# Patient Record
Sex: Female | Born: 1975 | Race: White | Hispanic: No | Smoking: Former smoker
Health system: Southern US, Community
[De-identification: ages and names within clinical notes are randomized; demographics above are authoritative.]

## PROBLEM LIST (undated history)

## (undated) DIAGNOSIS — M329 Systemic lupus erythematosus, unspecified: Secondary | ICD-10-CM

## (undated) DIAGNOSIS — IMO0002 Reserved for concepts with insufficient information to code with codable children: Secondary | ICD-10-CM

## (undated) DIAGNOSIS — M359 Systemic involvement of connective tissue, unspecified: Secondary | ICD-10-CM

## (undated) DIAGNOSIS — I2699 Other pulmonary embolism without acute cor pulmonale: Secondary | ICD-10-CM

## (undated) HISTORY — PX: TUBAL LIGATION: SHX77

## (undated) HISTORY — PX: PTOSIS REPAIR: SHX6568

## (undated) HISTORY — PX: CHOLECYSTECTOMY: SHX55

## (undated) HISTORY — PX: TONSILLECTOMY: SUR1361

## (undated) HISTORY — PX: ABDOMINAL HYSTERECTOMY: SHX81

---

## 2018-02-09 ENCOUNTER — Other Ambulatory Visit: Payer: Self-pay

## 2018-02-09 ENCOUNTER — Encounter (HOSPITAL_COMMUNITY): Payer: Self-pay | Admitting: Emergency Medicine

## 2018-02-09 ENCOUNTER — Ambulatory Visit (HOSPITAL_COMMUNITY)
Admission: EM | Admit: 2018-02-09 | Discharge: 2018-02-09 | Disposition: A | Discharge status: Home / Self Care | Payer: Self-pay | Attending: Internal Medicine | Admitting: Internal Medicine

## 2018-02-09 DIAGNOSIS — H0011 Chalazion right upper eyelid: Secondary | ICD-10-CM

## 2018-02-09 HISTORY — DX: Other pulmonary embolism without acute cor pulmonale: I26.99

## 2018-02-09 HISTORY — DX: Reserved for concepts with insufficient information to code with codable children: IMO0002

## 2018-02-09 HISTORY — DX: Systemic lupus erythematosus, unspecified: M32.9

## 2018-02-09 MED ORDER — DOXYCYCLINE HYCLATE 100 MG PO CAPS: 100.0000 mg | ORAL_CAPSULE | Freq: Two times a day (BID) | ORAL | 0 refills | Status: AC

## 2018-02-09 NOTE — ED Provider Notes (Signed)
[facility]    CSN: [id number] Arrival date & time: 02/09/18  1114     History   Chief Complaint Chief Complaint  Patient presents with  . Eye Problem    HPI Leslie Walls is a 42 y.o. female.   Leslie Walls presents with complaints of swelling to right upper lid which has been intermittent for 3 months but worse over the past three weeks. Painful. Last night her eye swelled shut, has improved today. No drainage. States she has been using her glasses more as she feels her vision has been more blurry. Glasses help with this. No drainage from eye. States has had to miss work occasionally related to swelling as she drives and felt unsafe driving. She states can cause her entire right side of face to hurt at times. Does not follow with ophthalmologist, moved here fairly recently. Hx of lupus. Does not take any prescribed medications.     ROS per HPI.      Past Medical History:  Diagnosis Date  . Lupus (HCC)   . Pulmonary embolism (HCC)     There are no active problems to display for this patient.   Past Surgical History:  Procedure Laterality Date  . ABDOMINAL HYSTERECTOMY    . CHOLECYSTECTOMY    . PTOSIS REPAIR    . TONSILLECTOMY    . TUBAL LIGATION      OB History   None      Home Medications    Prior to Admission medications   Medication Sig Start Date End Date Taking? Authorizing Provider  ibuprofen (ADVIL,MOTRIN) 200 MG tablet Take 200 mg by mouth every 6 (six) hours as needed.   Yes [provider]  doxycycline (VIBRAMYCIN) 100 MG capsule Take 1 capsule (100 mg total) by mouth 2 (two) times daily for 7 days. 02/09/18 02/16/18  Georgetta HaberBurky, Natalie B, NP    Family History Family History  Problem Relation Age of Onset  . Healthy Mother   . Healthy Father     Social History Social History   Tobacco Use  . Smoking status: Former Smoker  Substance Use Topics  . Alcohol use: Yes  . Drug use: Never     Allergies   Codeine; Septra  [sulfamethoxazole-trimethoprim]; and Vicodin [hydrocodone-acetaminophen]   Review of Systems Review of Systems   Physical Exam Triage Vital Signs ED Triage Vitals  Enc Vitals Group     BP 02/09/18 1259 (!) 122/92     Pulse Rate 02/09/18 1259 75     Resp 02/09/18 1259 18     Temp 02/09/18 1259 97.7 F (36.5 C)     Temp Source 02/09/18 1259 Oral     SpO2 02/09/18 1259 100 %     Weight --      Height --      Head Circumference --      Peak Flow --      Pain Score 02/09/18 1254 5     Pain Loc --      Pain Edu? --      Excl. in GC? --    No data found.  Updated Vital Signs BP 106/71 (BP Location: Right Arm)   Pulse 75   Temp 97.7 F (36.5 C) (Oral)   Resp 18   SpO2 100%   Visual Acuity Right Eye Distance: 20/40 Left Eye Distance: 20/40 Bilateral Distance: 20/40  Right Eye Near:   Left Eye Near:    Bilateral Near:     Physical Exam  Constitutional: She is oriented to person, place, and time. She appears well-developed and well-nourished. No distress.  HENT:  Head: Normocephalic and atraumatic.  Right Ear: External ear normal.  Left Ear: External ear normal.  Eyes: Pupils are equal, round, and reactive to light. Conjunctivae and EOM are normal. Right eye exhibits hordeolum.  Hordeolum to right upper lid with mild surrounding redness. No further swelling   Cardiovascular: Normal rate, regular rhythm and normal heart sounds.  Pulmonary/Chest: Effort normal and breath sounds normal.  Neurological: She is alert and oriented to person, place, and time.  Skin: Skin is warm and dry.     UC Treatments / Results  Labs (all labs ordered are listed, but only abnormal results are displayed) Labs Reviewed - No data to display  EKG None  Radiology No results found.  Procedures Procedures (including critical care time)  Medications Ordered in UC Medications - No data to display  Initial Impression / Assessment and Plan / UC Course  I have reviewed the triage  vital signs and the nursing notes.  Pertinent labs & imaging results that were available during my care of the patient were reviewed by me and considered in my medical decision making (see chart for details).     Eye ball WNL without acute findigns, no injury or exposure. Hordeolum present to lid. Persistent and with mild redness. Will provide antibiotics to cover any cellulitis present. Encouraged follow up in the next few days with ophthalmology as may need injection or further treatment. Patient verbalized understanding and agreeable to plan.    Final Clinical Impressions(s) / UC Diagnoses   Final diagnoses:  Chalazion of right upper eyelid     Discharge Instructions     Continue with warm compresses 3-4 times a day.  Tylenol and/or ibuprofen as needed for pain.  Complete course of antibiotics.  Please follow up with ophthalmology as may need further treatment and would benefit from further vision screening.     ED Prescriptions    Medication Sig Dispense Auth. Provider   doxycycline (VIBRAMYCIN) 100 MG capsule Take 1 capsule (100 mg total) by mouth 2 (two) times daily for 7 days. 14 capsule Georgetta Haber, NP     Controlled Substance Prescriptions [place] Controlled Substance Registry consulted? Not Applicable   Georgetta Haber, NP 02/09/18 1330

## 2018-02-09 NOTE — ED Triage Notes (Signed)
Patient has knot in upper eye lid for 3 weeks.  Patient says this has varied in size over that time frame.  Last night reports right eye was swelled shut.  Swelling has reduced today.  Headache on right side of head.  Bump to right eye lid itches

## 2018-02-09 NOTE — Discharge Instructions (Signed)
Continue with warm compresses 3-4 times a day.  Tylenol and/or ibuprofen as needed for pain.  Complete course of antibiotics.  Please follow up with ophthalmology as may need further treatment and would benefit from further vision screening.

## 2018-03-12 ENCOUNTER — Encounter: Payer: Self-pay | Admitting: Emergency Medicine

## 2018-03-12 ENCOUNTER — Emergency Department: Payer: Self-pay

## 2018-03-12 ENCOUNTER — Emergency Department
Admission: EM | Admit: 2018-03-12 | Discharge: 2018-03-12 | Disposition: A | Discharge status: Home / Self Care | Payer: Self-pay | Attending: Emergency Medicine | Admitting: Emergency Medicine

## 2018-03-12 ENCOUNTER — Other Ambulatory Visit: Payer: Self-pay

## 2018-03-12 DIAGNOSIS — R0789 Other chest pain: Secondary | ICD-10-CM

## 2018-03-12 DIAGNOSIS — R110 Nausea: Secondary | ICD-10-CM | POA: Insufficient documentation

## 2018-03-12 DIAGNOSIS — R0602 Shortness of breath: Secondary | ICD-10-CM

## 2018-03-12 DIAGNOSIS — R197 Diarrhea, unspecified: Secondary | ICD-10-CM

## 2018-03-12 DIAGNOSIS — Z87891 Personal history of nicotine dependence: Secondary | ICD-10-CM | POA: Insufficient documentation

## 2018-03-12 DIAGNOSIS — R2242 Localized swelling, mass and lump, left lower limb: Secondary | ICD-10-CM | POA: Insufficient documentation

## 2018-03-12 DIAGNOSIS — R002 Palpitations: Secondary | ICD-10-CM | POA: Insufficient documentation

## 2018-03-12 HISTORY — DX: Systemic involvement of connective tissue, unspecified: M35.9

## 2018-03-12 LAB — URINALYSIS, COMPLETE (UACMP) WITH MICROSCOPIC
Bacteria, UA: NONE SEEN
Bilirubin Urine: NEGATIVE
GLUCOSE, UA: NEGATIVE mg/dL
Ketones, ur: NEGATIVE mg/dL
Leukocytes, UA: NEGATIVE
Nitrite: NEGATIVE
PH: 7 (ref 5.0–8.0)
PROTEIN: NEGATIVE mg/dL
Specific Gravity, Urine: 1.004 — ABNORMAL LOW (ref 1.005–1.030)
WBC, UA: NONE SEEN WBC/hpf (ref 0–5)

## 2018-03-12 LAB — BASIC METABOLIC PANEL
Anion gap: 9 (ref 5–15)
BUN: 9 mg/dL (ref 6–20)
CO2: 20 mmol/L — AB (ref 22–32)
CREATININE: 0.7 mg/dL (ref 0.44–1.00)
Calcium: 9.3 mg/dL (ref 8.9–10.3)
Chloride: 107 mmol/L (ref 98–111)
GFR calc Af Amer: >60 mL/min (ref >60)
GFR calc non Af Amer: >60 mL/min (ref >60)
Glucose, Bld: 103 mg/dL — ABNORMAL HIGH (ref 70–99)
Potassium: 4.3 mmol/L (ref 3.5–5.1)
SODIUM: 136 mmol/L (ref 135–145)

## 2018-03-12 LAB — HEPATIC FUNCTION PANEL
ALT: 29 U/L (ref 0–44)
AST: 27 U/L (ref 15–41)
Albumin: 4.4 g/dL (ref 3.5–5.0)
Alkaline Phosphatase: 72 U/L (ref 38–126)
BILIRUBIN TOTAL: 0.6 mg/dL (ref 0.3–1.2)
Total Protein: 7.2 g/dL (ref 6.5–8.1)

## 2018-03-12 LAB — CBC
HEMATOCRIT: 46.7 % — AB (ref 36.0–46.0)
Hemoglobin: 16 g/dL — ABNORMAL HIGH (ref 12.0–15.0)
MCH: 30.2 pg (ref 26.0–34.0)
MCHC: 34.3 g/dL (ref 30.0–36.0)
MCV: 88.3 fL (ref 80.0–100.0)
Platelets: 345 10*3/uL (ref 150–400)
RBC: 5.29 MIL/uL — ABNORMAL HIGH (ref 3.87–5.11)
RDW: 12.5 % (ref 11.5–15.5)
WBC: 9.5 10*3/uL (ref 4.0–10.5)
nRBC: 0 % (ref 0.0–0.2)

## 2018-03-12 LAB — TROPONIN I: Troponin I: <0.03 ng/mL (ref <0.03)

## 2018-03-12 LAB — LIPASE, BLOOD: LIPASE: 30 U/L (ref 11–51)

## 2018-03-12 MED ORDER — IOHEXOL 350 MG/ML SOLN
75.0000 mL | Freq: Once | INTRAVENOUS | Status: AC | PRN
  Administered 2018-03-12: 75 mL via INTRAVENOUS

## 2018-03-12 MED ORDER — ONDANSETRON HCL 4 MG/2ML IJ SOLN
4.0000 mg | Freq: Once | INTRAMUSCULAR | Status: AC
  Administered 2018-03-12: 4 mg via INTRAVENOUS
  Filled 2018-03-12: qty 2

## 2018-03-12 MED ORDER — SODIUM CHLORIDE 0.9 % IV BOLUS: 1000.0000 mL | Freq: Once | INTRAVENOUS | Status: DC

## 2018-03-12 NOTE — Discharge Instructions (Signed)
Stay hydrated.   You can take imodium as needed for diarrhea.   See your doctor for follow up   Return to ER if you have abdominal pain, vomiting, fever, trouble breathing, shortness of breath

## 2018-03-12 NOTE — ED Notes (Signed)
First Nurse Note: Patient arrives in [facility] clutching chest states she has chest pain.  Taken to Triage 1.

## 2018-03-12 NOTE — ED Notes (Signed)
Pt given warm blanket.

## 2018-03-12 NOTE — ED Triage Notes (Signed)
Pt presents to ED via POV c/o L-sided chest pain radiating to L shoulder and back that started suddenly this morning. Pt states hx PE. +SOB

## 2018-03-12 NOTE — ED Notes (Addendum)
Pt states on Friday she was on a hike and started having chest pain, pain has progressed. Pt shes having chest pain/tightness that is similar to when she had PE. Pt is SOB. Pt states that she is also having diarrhea that started yesterday and a cough, states she coughted up blood in loby.

## 2018-03-12 NOTE — ED Notes (Signed)
Patient transported to CT 

## 2018-03-12 NOTE — ED Provider Notes (Signed)
[facility] EMERGENCY DEPARTMENT Provider Note   CSN: [id number] Arrival date & time: 03/12/18  4098     History   Chief Complaint Chief Complaint  Patient presents with  . Chest Pain    HPI Leslie Walls is a 42 y.o. female history of lupus, pulmonary embolus, here presenting with shortness of breath, chest pain, diarrhea.  Patient states that she has been having some palpitations as well as some diarrhea since yesterday.  She states that she had watery diarrhea and used the bathroom multiple times.  Also felt nauseated as well.  She states that since last night she had some worsening chest tightness and pleuritic chest pain.  Patient states that about 5 days ago she went hiking and she felt very out of breath.  Did had an episode of blood-tinged sputum this morning.  She states that she is from [place] and she had a history of blood clot previously.  She states that she had no recent travel.  She was told that her blood clot originated from her right leg and her risk factor was oral contraceptives.  Patient states that she has left leg pain currently and is not currently on any oral contraceptives or blood thinners.  She is also not on any medicines for lupus.   The history is provided by the patient.    Past Medical History:  Diagnosis Date  . Collagen vascular disease (HCC)   . Lupus (HCC)   . Pulmonary embolism (HCC)     There are no active problems to display for this patient.   Past Surgical History:  Procedure Laterality Date  . ABDOMINAL HYSTERECTOMY    . CHOLECYSTECTOMY    . PTOSIS REPAIR    . TONSILLECTOMY    . TUBAL LIGATION       OB History   None      Home Medications    Prior to Admission medications   Medication Sig Start Date End Date Taking? Authorizing Provider  ibuprofen (ADVIL,MOTRIN) 200 MG tablet Take 200 mg by mouth every 6 (six) hours as needed.    [provider]    Family History Family History  Problem  Relation Age of Onset  . Healthy Mother   . Healthy Father     Social History Social History   Tobacco Use  . Smoking status: Former Smoker    Types: Cigarettes    Last attempt to quit: 2008    Years since quitting: 11.8  . Smokeless tobacco: Never Used  Substance Use Topics  . Alcohol use: Yes  . Drug use: Never     Allergies   Codeine; Septra [sulfamethoxazole-trimethoprim]; and Vicodin [hydrocodone-acetaminophen]   Review of Systems Review of Systems  Cardiovascular: Positive for chest pain.  Gastrointestinal: Positive for diarrhea.  All other systems reviewed and are negative.    Physical Exam Updated Vital Signs BP 120/88 (BP Location: Right Arm)   Pulse 92   Temp (!) 97.5 F (36.4 C) (Oral)   Resp 20   Ht 5' 1.5" (1.562 m)   Wt 77.1 kg   SpO2 96%   BMI 31.60 kg/m   Physical Exam  Constitutional: She is oriented to person, place, and time.  Slightly short of breath, tachypneic   HENT:  Head: Normocephalic.  MM slightly dry   Eyes: Pupils are equal, round, and reactive to light. EOM are normal.  Neck: Normal range of motion. Neck supple.  Cardiovascular: Normal rate, regular rhythm and normal pulses.  Pulmonary/Chest: Effort normal and breath sounds normal.  Abdominal: Soft. Bowel sounds are normal.  Musculoskeletal: Normal range of motion.       Right lower leg: Normal.  ? Mild L calf tenderness   Neurological: She is alert and oriented to person, place, and time.  Skin: Skin is warm. Capillary refill takes less than 2 seconds.  Psychiatric: She has a normal mood and affect.  Nursing note and vitals reviewed.    ED Treatments / Results  Labs (all labs ordered are listed, but only abnormal results are displayed) Labs Reviewed  BASIC METABOLIC PANEL - Abnormal; Notable for the following components:      Result Value   CO2 20 (*)    Glucose, Bld 103 (*)    All other components within normal limits  CBC - Abnormal; Notable for the following  components:   RBC 5.29 (*)    Hemoglobin 16.0 (*)    HCT 46.7 (*)    All other components within normal limits  URINALYSIS, COMPLETE (UACMP) WITH MICROSCOPIC - Abnormal; Notable for the following components:   Color, Urine YELLOW (*)    APPearance CLEAR (*)    Specific Gravity, Urine 1.004 (*)    Hgb urine dipstick SMALL (*)    All other components within normal limits  TROPONIN I  HEPATIC FUNCTION PANEL  LIPASE, BLOOD    EKG EKG Interpretation  Date/Time:  Wednesday March 12 2018 10:01:55 EDT Ventricular Rate:  78 PR Interval:  142 QRS Duration: 70 QT Interval:  372 QTC Calculation: 424 R Axis:   17 Text Interpretation:  Normal sinus rhythm Normal ECG No previous ECGs available Confirmed by Richardean Canal [phone]) on 03/12/2018 10:41:18 AM   Radiology Dg Chest 2 View  Result Date: 03/12/2018 CLINICAL DATA:  Left-sided chest pain and shortness of breath for 5 days. Hemoptysis. Personal history of pulmonary embolism. EXAM: CHEST - 2 VIEW COMPARISON:  None. FINDINGS: The heart size and mediastinal contours are within normal limits. Both lungs are clear. The visualized skeletal structures are unremarkable. IMPRESSION: Negative.  No active cardiopulmonary disease. Electronically Signed   By: Myles Rosenthal M.D.   On: 03/12/2018 10:52   Ct Angio Chest Pe W And/or Wo Contrast  Result Date: 03/12/2018 CLINICAL DATA:  42 year old female with acute onset chest pain while hiking last week. Symptoms similar to a prior pulmonary embolus. Diarrhea and cough with some hemoptysis. EXAM: CT ANGIOGRAPHY CHEST WITH CONTRAST TECHNIQUE: Multidetector CT imaging of the chest was performed using the standard protocol during bolus administration of intravenous contrast. Multiplanar CT image reconstructions and MIPs were obtained to evaluate the vascular anatomy. CONTRAST:  75mL OMNIPAQUE IOHEXOL 350 MG/ML SOLN COMPARISON:  None available. FINDINGS: Cardiovascular: Excellent contrast bolus timing in the  pulmonary arterial tree. Mild respiratory motion. No focal filling defect identified in the pulmonary arteries to suggest acute pulmonary embolism. No cardiomegaly or pericardial effusion. Negative visible aorta. No calcified coronary artery atherosclerosis is evident. Mediastinum/Nodes: Negative. Lungs/Pleura: Somewhat low lung volumes with bilateral pulmonary ground-glass opacity favored to reflect atelectasis. The major airways are patent aside from some atelectatic changes. No pleural effusion or confluent pulmonary opacity. Upper Abdomen: Surgically absent gallbladder. Negative visible liver, spleen, pancreas, adrenal glands, kidneys, and bowel in the upper abdomen. Musculoskeletal: Negative. Review of the MIP images confirms the above findings. IMPRESSION: 1.  Negative for acute pulmonary embolus. 2. Negative chest CTA aside from pulmonary ground-glass opacity suspected due to atelectasis. Electronically Signed   By: Odessa Fleming  M.D.   On: 03/12/2018 12:36   US Venous Img Lower Unilateral Left  Result Date: 03/12/2018 CLINICAL DATA:  Left lower extremity edema. History of pulmonary embolism. Evaluate for DVT. EXAM: LEFT LOWER EXTREMITY VENOUS DOPPLER ULTRASOUND TECHNIQUE: Gray-scale sonography with graded compression, as well as color Doppler and duplex ultrasound were performed to evaluate the lower extremity deep venous systems from the level of the common femoral vein and including the common femoral, femoral, profunda femoral, popliteal and calf veins including the posterior tibial, peroneal and gastrocnemius veins when visible. The superficial great saphenous vein was also interrogated. Spectral Doppler was utilized to evaluate flow at rest and with distal augmentation maneuvers in the common femoral, femoral and popliteal veins. COMPARISON:  None. FINDINGS: Contralateral Common Femoral Vein: Respiratory phasicity is normal and symmetric with the symptomatic side. No evidence of thrombus. Normal  compressibility. Common Femoral Vein: No evidence of thrombus. Normal compressibility, respiratory phasicity and response to augmentation. Saphenofemoral Junction: No evidence of thrombus. Normal compressibility and flow on color Doppler imaging. Profunda Femoral Vein: No evidence of thrombus. Normal compressibility and flow on color Doppler imaging. Femoral Vein: No evidence of thrombus. Normal compressibility, respiratory phasicity and response to augmentation. Popliteal Vein: No evidence of acute DVT. There is a thin echogenic nonocclusive intimal web within the left popliteal vein (images 26 - 29), potentially the sequela of prior DVT. Calf Veins: No evidence of thrombus. Normal compressibility and flow on color Doppler imaging. Superficial Great Saphenous Vein: No evidence of thrombus. Normal compressibility. Venous Reflux:  None. Other Findings:  None. IMPRESSION: 1. No evidence of acute DVT within the left lower extremity. 2. Thin echogenic nonocclusive intimal web within the left popliteal vein, potentially the sequela of prior DVT. Electronically Signed   By: Simonne Come M.D.   On: 03/12/2018 11:54    Procedures Procedures (including critical care time)  Medications Ordered in ED Medications  sodium chloride 0.9 % bolus 1,000 mL (1,000 mLs Intravenous Bolus 03/12/18 1050)  ondansetron (ZOFRAN) injection 4 mg (4 mg Intravenous Given 03/12/18 1049)  iohexol (OMNIPAQUE) 350 MG/ML injection 75 mL (75 mLs Intravenous Contrast Given 03/12/18 1208)     Initial Impression / Assessment and Plan / ED Course  I have reviewed the triage vital signs and the nursing notes.  Pertinent labs & imaging results that were available during my care of the patient were reviewed by me and considered in my medical decision making (see chart for details).    Hila Bolding is a 42 y.o. female here with SOB, diarrhea, L leg pain. Hx of PE and lupus. Will get labs, CTA chest, DVT L leg. Will hydrate and reassess.    1:19 PM Labs unremarkable, UA nl. US showed no acute DVT, just previous clot. CTA showed no PE. Hemodynamically stable. Will dc home.    Final Clinical Impressions(s) / ED Diagnoses   Final diagnoses:  None    ED Discharge Orders    None       Charlynne Pander, MD 03/12/18 1320

## 2018-04-14 ENCOUNTER — Encounter (HOSPITAL_COMMUNITY): Payer: Self-pay | Admitting: Emergency Medicine

## 2018-04-14 ENCOUNTER — Other Ambulatory Visit: Payer: Self-pay

## 2018-04-14 ENCOUNTER — Ambulatory Visit (HOSPITAL_COMMUNITY)
Admission: EM | Admit: 2018-04-14 | Discharge: 2018-04-14 | Disposition: A | Discharge status: Home / Self Care | Payer: Self-pay | Attending: Family Medicine | Admitting: Family Medicine

## 2018-04-14 DIAGNOSIS — J189 Pneumonia, unspecified organism: Secondary | ICD-10-CM

## 2018-04-14 NOTE — ED Triage Notes (Signed)
Pt states she was diagnosed with pleurisy and pneumonia about three weeks ago at [facility].  Pt states they wrote her a letter for work but filled it out stating she cant work outside.  Pt states she took her letter to work and they wont accept it because she does have to go outside for her job and they will not let her work.  Pt wants to be cleared to go back to work no restrictions.

## 2018-04-16 NOTE — ED Provider Notes (Signed)
[facility]   [id number] 04/14/18 Arrival Time: 1223  ASSESSMENT & PLAN:  1. Pneumonia of left lung due to infectious organism, unspecified part of lung    Feeling much better and requests note for return to work. No other concerns. Note given.  May f/u with PCP or here as needed.  Reviewed expectations re: course of current medical issues. Questions answered. Outlined signs and symptoms indicating need for more acute intervention. Patient verbalized understanding. After Visit Summary given.   SUBJECTIVE: History from: patient.  Leslie Walls is a 42 y.o. female who has been treated for pneumonia a few weeks ago. Finished antibiotic. She is feeling well and without concerns. No SOB. Only occasional non-productive coughing. Here because she needs a note to return to work.  Social History   Tobacco Use  Smoking Status Former Smoker  . Types: Cigarettes  . Last attempt to quit: 2008  . Years since quitting: 11.9  Smokeless Tobacco Never Used    ROS: As per HPI.   OBJECTIVE:  Vitals:   04/14/18 1320  BP: (!) 150/83  Pulse: 68  Resp: 16  Temp: 98.2 F (36.8 C)  TempSrc: Oral  SpO2: 100%     General appearance: alert; appears well HEENT: normal Neck: supple without LAD Lungs: unlabored respirations, symmetrical air entry without wheezing; cough: mild Psychological: alert and cooperative; normal mood and affect    Allergies  Allergen Reactions  . Codeine Nausea And Vomiting  . Septra [Sulfamethoxazole-Trimethoprim] Nausea And Vomiting  . Vicodin [Hydrocodone-Acetaminophen] Hives    Past Medical History:  Diagnosis Date  . Collagen vascular disease (HCC)   . Lupus (HCC)   . Pulmonary embolism (HCC)    Family History  Problem Relation Age of Onset  . Healthy Mother   . Healthy Father    Social History   Socioeconomic History  . Marital status: Widowed    Spouse name: Not on file  . Number of children: Not on file  . Years of  education: Not on file  . Highest education level: Not on file  Occupational History  . Not on file  Social Needs  . Financial resource strain: Not on file  . Food insecurity:    Worry: Not on file    Inability: Not on file  . Transportation needs:    Medical: Not on file    Non-medical: Not on file  Tobacco Use  . Smoking status: Former Smoker    Types: Cigarettes    Last attempt to quit: 2008    Years since quitting: 11.9  . Smokeless tobacco: Never Used  Substance and Sexual Activity  . Alcohol use: Yes  . Drug use: Never  . Sexual activity: Not on file  Lifestyle  . Physical activity:    Days per week: Not on file    Minutes per session: Not on file  . Stress: Not on file  Relationships  . Social connections:    Talks on phone: Not on file    Gets together: Not on file    Attends religious service: Not on file    Active member of club or organization: Not on file    Attends meetings of clubs or organizations: Not on file    Relationship status: Not on file  . Intimate partner violence:    Fear of current or ex partner: Not on file    Emotionally abused: Not on file    Physically abused: Not on file    Forced sexual activity:  Not on file  Other Topics Concern  . Not on file  Social History Narrative  . Not on file           Mardella Layman, MD 04/16/18 [phone]

## 2018-05-10 ENCOUNTER — Encounter (HOSPITAL_COMMUNITY): Payer: Self-pay

## 2018-05-10 ENCOUNTER — Other Ambulatory Visit: Payer: Self-pay

## 2018-05-10 ENCOUNTER — Emergency Department (HOSPITAL_COMMUNITY)
Admission: EM | Admit: 2018-05-10 | Discharge: 2018-05-10 | Disposition: A | Discharge status: Home / Self Care | Payer: Self-pay | Attending: Emergency Medicine | Admitting: Emergency Medicine

## 2018-05-10 ENCOUNTER — Emergency Department (HOSPITAL_COMMUNITY): Payer: Self-pay

## 2018-05-10 DIAGNOSIS — J4531 Mild persistent asthma with (acute) exacerbation: Secondary | ICD-10-CM | POA: Insufficient documentation

## 2018-05-10 DIAGNOSIS — Z86718 Personal history of other venous thrombosis and embolism: Secondary | ICD-10-CM | POA: Insufficient documentation

## 2018-05-10 DIAGNOSIS — J45901 Unspecified asthma with (acute) exacerbation: Secondary | ICD-10-CM

## 2018-05-10 DIAGNOSIS — J069 Acute upper respiratory infection, unspecified: Secondary | ICD-10-CM

## 2018-05-10 DIAGNOSIS — Z87891 Personal history of nicotine dependence: Secondary | ICD-10-CM | POA: Insufficient documentation

## 2018-05-10 LAB — BASIC METABOLIC PANEL
Anion gap: 9 (ref 5–15)
BUN: 10 mg/dL (ref 6–20)
CALCIUM: 9.5 mg/dL (ref 8.9–10.3)
CHLORIDE: 104 mmol/L (ref 98–111)
CO2: 25 mmol/L (ref 22–32)
Creatinine, Ser: 0.68 mg/dL (ref 0.44–1.00)
GFR calc Af Amer: >60 mL/min (ref >60)
GFR calc non Af Amer: >60 mL/min (ref >60)
GLUCOSE: 100 mg/dL — AB (ref 70–99)
Potassium: 4 mmol/L (ref 3.5–5.1)
Sodium: 138 mmol/L (ref 135–145)

## 2018-05-10 LAB — CBC WITH DIFFERENTIAL/PLATELET
Abs Immature Granulocytes: 0.02 10*3/uL (ref 0.00–0.07)
Basophils Absolute: 0.1 10*3/uL (ref 0.0–0.1)
Basophils Relative: 1 %
EOS ABS: 0.3 10*3/uL (ref 0.0–0.5)
EOS PCT: 4 %
HEMATOCRIT: 43.2 % (ref 36.0–46.0)
HEMOGLOBIN: 14.6 g/dL (ref 12.0–15.0)
Immature Granulocytes: 0 %
LYMPHS PCT: 35 %
Lymphs Abs: 3.1 10*3/uL (ref 0.7–4.0)
MCH: 30.5 pg (ref 26.0–34.0)
MCHC: 33.8 g/dL (ref 30.0–36.0)
MCV: 90.2 fL (ref 80.0–100.0)
MONOS PCT: 8 %
Monocytes Absolute: 0.7 10*3/uL (ref 0.1–1.0)
Neutro Abs: 4.7 10*3/uL (ref 1.7–7.7)
Neutrophils Relative %: 52 %
Platelets: 317 10*3/uL (ref 150–400)
RBC: 4.79 MIL/uL (ref 3.87–5.11)
RDW: 12.9 % (ref 11.5–15.5)
WBC: 8.8 10*3/uL (ref 4.0–10.5)
nRBC: 0 % (ref 0.0–0.2)

## 2018-05-10 LAB — I-STAT BETA HCG BLOOD, ED (MC, WL, AP ONLY)

## 2018-05-10 LAB — I-STAT TROPONIN, ED: Troponin i, poc: 0.01 ng/mL (ref 0.00–0.08)

## 2018-05-10 LAB — D-DIMER, QUANTITATIVE: D-Dimer, Quant: 0.31 ug/mL-FEU (ref 0.00–0.50)

## 2018-05-10 MED ORDER — PREDNISONE 20 MG PO TABS: 40.0000 mg | ORAL_TABLET | Freq: Every day | ORAL | 0 refills | Status: AC

## 2018-05-10 MED ORDER — MORPHINE SULFATE (PF) 4 MG/ML IV SOLN
4.0000 mg | Freq: Once | INTRAVENOUS | Status: AC
  Administered 2018-05-10: 4 mg via INTRAVENOUS
  Filled 2018-05-10: qty 1

## 2018-05-10 MED ORDER — PREDNISONE 20 MG PO TABS: 20.0000 mg | ORAL_TABLET | Freq: Every day | ORAL | 0 refills | Status: DC

## 2018-05-10 MED ORDER — ALBUTEROL SULFATE HFA 108 (90 BASE) MCG/ACT IN AERS
2.0000 | INHALATION_SPRAY | Freq: Once | RESPIRATORY_TRACT | Status: AC
  Administered 2018-05-10: 2 via RESPIRATORY_TRACT
  Filled 2018-05-10: qty 6.7

## 2018-05-10 MED ORDER — PREDNISONE 20 MG PO TABS
40.0000 mg | ORAL_TABLET | Freq: Once | ORAL | Status: AC
  Administered 2018-05-10: 40 mg via ORAL
  Filled 2018-05-10: qty 2

## 2018-05-10 NOTE — ED Triage Notes (Signed)
Pt has had pneumonia since October, hx of PE and lupus. Presents today with SOB, chest pain radiating to back, dizziness, diahrrea  No blood thinners

## 2018-05-10 NOTE — ED Provider Notes (Signed)
[facility] EMERGENCY DEPARTMENT Provider Note   CSN: [id number] Arrival date & time: 05/10/18  1130     History   Chief Complaint Chief Complaint  Patient presents with  . Shortness of Breath    HPI Leslie Walls is a 42 y.o. female.   Cough  This is a new problem. The current episode started more than 2 days ago. The problem occurs constantly. The problem has not changed since onset.The cough is non-productive. There has been no fever. The fever has been present for less than 1 day. Associated symptoms include chest pain and shortness of breath. Pertinent negatives include no chills, no ear pain, no sore throat and no myalgias. She has tried nothing for the symptoms. The treatment provided no relief.    Past Medical History:  Diagnosis Date  . Collagen vascular disease (HCC)   . Lupus (HCC)   . Pulmonary embolism (HCC)     There are no active problems to display for this patient.   Past Surgical History:  Procedure Laterality Date  . ABDOMINAL HYSTERECTOMY    . CHOLECYSTECTOMY    . PTOSIS REPAIR    . TONSILLECTOMY    . TUBAL LIGATION       OB History   No obstetric history on file.      Home Medications    Prior to Admission medications   Medication Sig Start Date End Date Taking? Authorizing Provider  Artificial Tear Ointment (DRY EYES OP) Apply 2 drops to eye daily as needed (for dry eyes).   Yes [provider]  diphenhydrAMINE (BENADRYL) 25 MG tablet Take 25 mg by mouth every 6 (six) hours as needed for itching or allergies.   Yes [provider]  ibuprofen (ADVIL,MOTRIN) 200 MG tablet Take 800 mg by mouth every 6 (six) hours as needed for fever or moderate pain.    Yes [provider]  predniSONE (DELTASONE) 20 MG tablet Take 2 tablets (40 mg total) by mouth daily for 4 days. 05/10/18 05/14/18  Virgina Norfolk, DO    Family History Family History  Problem Relation Age of Onset  . Healthy Mother   .  Healthy Father     Social History Social History   Tobacco Use  . Smoking status: Former Smoker    Types: Cigarettes    Last attempt to quit: 2008    Years since quitting: 11.9  . Smokeless tobacco: Never Used  Substance Use Topics  . Alcohol use: Yes  . Drug use: Never     Allergies   Codeine; Septra [sulfamethoxazole-trimethoprim]; and Vicodin [hydrocodone-acetaminophen]   Review of Systems Review of Systems  Constitutional: Negative for chills and fever.  HENT: Negative for ear pain and sore throat.   Eyes: Negative for pain and visual disturbance.  Respiratory: Positive for cough and shortness of breath.   Cardiovascular: Positive for chest pain. Negative for palpitations.  Gastrointestinal: Negative for abdominal pain and vomiting.  Genitourinary: Negative for dysuria and hematuria.  Musculoskeletal: Negative for arthralgias, back pain and myalgias.  Skin: Negative for color change and rash.  Neurological: Negative for seizures and syncope.  All other systems reviewed and are negative.    Physical Exam Updated Vital Signs  ED Triage Vitals  Enc Vitals Group     BP 05/10/18 1139 (!) 106/95     Pulse Rate 05/10/18 1139 80     Resp 05/10/18 1300 18     Temp 05/10/18 1139 97.9 F (36.6 C)  Temp Source 05/10/18 1139 Oral     SpO2 05/10/18 1139 99 %     Weight 05/10/18 1145 160 lb (72.6 kg)     Height 05/10/18 1145 5\' 1"  (1.549 m)     Head Circumference --      Peak Flow --      Pain Score 05/10/18 1142 8     Pain Loc --      Pain Edu? --      Excl. in GC? --     Physical Exam Vitals signs and nursing note reviewed.  Constitutional:      General: She is not in acute distress.    Appearance: She is well-developed.  HENT:     Head: Normocephalic and atraumatic.     Mouth/Throat:     Mouth: Mucous membranes are moist.  Eyes:     Conjunctiva/sclera: Conjunctivae normal.     Pupils: Pupils are equal, round, and reactive to light.  Neck:      Musculoskeletal: Normal range of motion and neck supple.  Cardiovascular:     Rate and Rhythm: Normal rate and regular rhythm.     Pulses: Normal pulses.     Heart sounds: Normal heart sounds. No murmur.  Pulmonary:     Effort: No tachypnea or respiratory distress.     Breath sounds: Rhonchi present. No decreased breath sounds, wheezing or rales.  Chest:     Chest wall: No tenderness.  Abdominal:     Palpations: Abdomen is soft.     Tenderness: There is no abdominal tenderness.  Musculoskeletal: Normal range of motion.     Right lower leg: No edema.     Left lower leg: No edema.  Skin:    General: Skin is warm and dry.     Capillary Refill: Capillary refill takes less than 2 seconds.  Neurological:     General: No focal deficit present.     Mental Status: She is alert and oriented to person, place, and time.     Cranial Nerves: No cranial nerve deficit.     Motor: No weakness.     Comments: 5+/5 muscle strength, normal sensation, no drift, normal finger to nose finger  Psychiatric:        Mood and Affect: Mood normal.      ED Treatments / Results  Labs (all labs ordered are listed, but only abnormal results are displayed) Labs Reviewed  BASIC METABOLIC PANEL - Abnormal; Notable for the following components:      Result Value   Glucose, Bld 100 (*)    All other components within normal limits  CBC WITH DIFFERENTIAL/PLATELET  D-DIMER, QUANTITATIVE ([facility])  I-STAT TROPONIN, ED  I-STAT BETA HCG BLOOD, ED ([facility], [facility], [facility] ONLY)    EKG EKG Interpretation  Date/Time:  Saturday May 10 2018 11:40:43 EST Ventricular Rate:  83 PR Interval:    QRS Duration: 99 QT Interval:  367 QTC Calculation: 432 R Axis:   21 Text Interpretation:  Sinus rhythm Low voltage, precordial leads RSR' in V1 or V2, probably normal variant Confirmed by Virgina NorfolkAdam, Junetta Hearn [phone]) on 05/10/2018 12:05:37 PM Also confirmed by Virgina NorfolkAdam, Feather Berrie [phone]), editor Barbette Hairassel, Kerry [phone]  on 05/10/2018  12:19:20 PM   Radiology Dg Chest 2 View  Result Date: 05/10/2018 CLINICAL DATA:  Productive cough and shortness of breath for 2 months. Recent pneumonia. EXAM: CHEST - 2 VIEW COMPARISON:  03/31/2018 FINDINGS: The heart size and mediastinal contours are within normal limits. Both lungs are  clear. Previously seen streaky opacity in the right infrahilar region is no longer visualized. The visualized skeletal structures are unremarkable. IMPRESSION: No active cardiopulmonary disease. Electronically Signed   By: Myles RosenthalJohn  Stahl M.D.   On: 05/10/2018 14:10    Procedures Procedures (including critical care time)  Medications Ordered in ED Medications  morphine 4 MG/ML injection 4 mg (4 mg Intravenous Given 05/10/18 1342)  albuterol (PROVENTIL HFA;VENTOLIN HFA) 108 (90 Base) MCG/ACT inhaler 2 puff (2 puffs Inhalation Given 05/10/18 1431)  predniSONE (DELTASONE) tablet 40 mg (40 mg Oral Given 05/10/18 1431)     Initial Impression / Assessment and Plan / ED Course  I have reviewed the triage vital signs and the nursing notes.  Pertinent labs & imaging results that were available during my care of the patient were reviewed by me and considered in my medical decision making (see chart for details).     Raynelle CharyRayna Ritchie is a 42 year old female with history of asthma, lupus who presents to the ED with cough, congestion.  Patient with normal vitals.  No fever.  EKG shows sinus rhythm.  Chest pain only when she coughs.  Troponin within normal limits.  Doubt ACS.  Patient with some coarse breath sounds on exam.  Otherwise exam is unremarkable.  No signs of volume overload.  D-dimer within normal limits and doubt PE.  Patient with no signs of pneumonia, pneumothorax, pleural effusion on chest x-ray.  No significant anemia, electrolyte abnormality, kidney injury.  Patient likely with viral process.  Possibly mild asthma exacerbation.  She was given morphine, albuterol, prednisone.  Given prescription for prednisone.   Recommend follow-up with primary care doctor and discharged from ED in good condition.  This chart was dictated using voice recognition software.  Despite best efforts to proofread,  errors can occur which can change the documentation meaning.   Final Clinical Impressions(s) / ED Diagnoses   Final diagnoses:  Upper respiratory tract infection, unspecified type  Mild asthma with exacerbation, unspecified whether persistent    ED Discharge Orders         Ordered    predniSONE (DELTASONE) 20 MG tablet  Daily,   Status:  Discontinued     05/10/18 1514    predniSONE (DELTASONE) 20 MG tablet  Daily     05/10/18 1514           Virgina NorfolkCuratolo, Zahriyah Joo, DO 05/10/18 1543

## 2019-01-19 IMAGING — US US EXTREM LOW VENOUS*L*
1 series · 13 of 24 positions shown · non-contrast
Comparison: None.

CLINICAL DATA: Left lower extremity edema. History of pulmonary
embolism. Evaluate for DVT.



[Series 1: us extrem low venous*left* · 0.05mm/px · 13 of 36 slices shown]
[im 1/36]
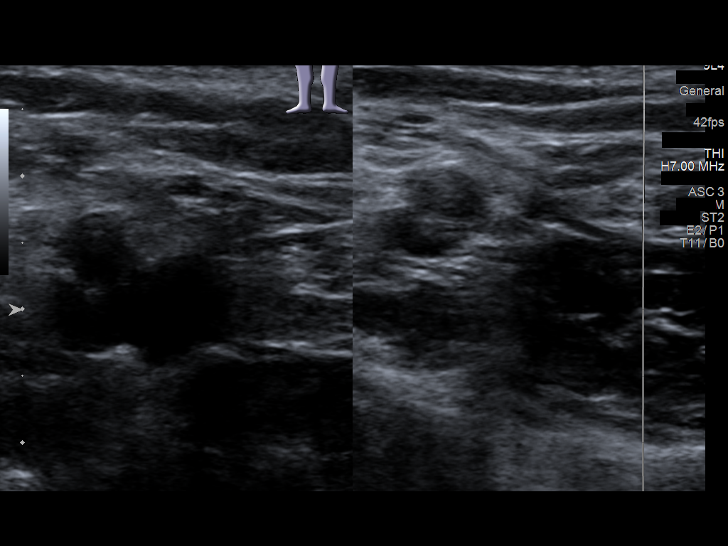
[im 4/36]
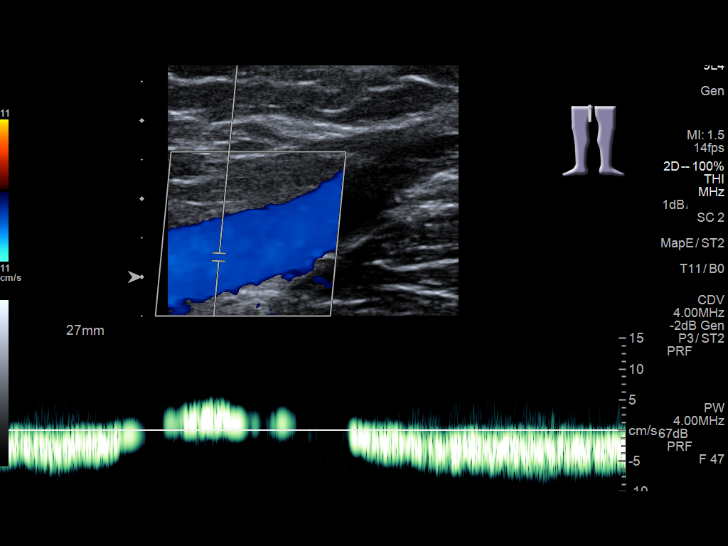
[im 7/36]
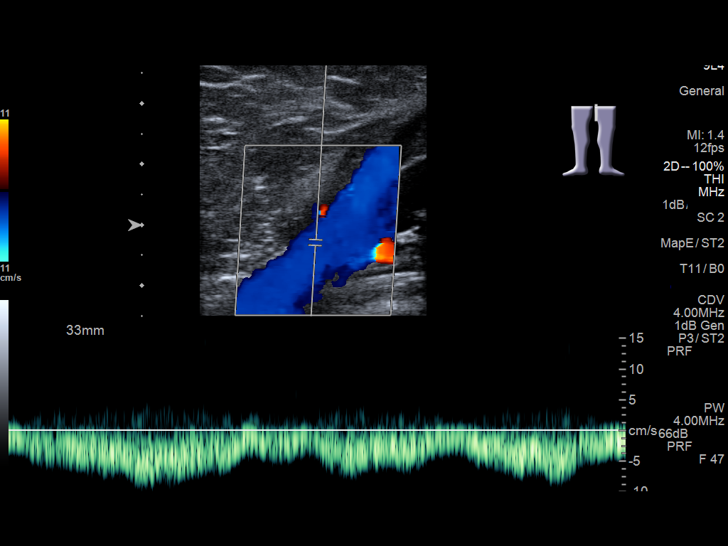
[im 10/36]
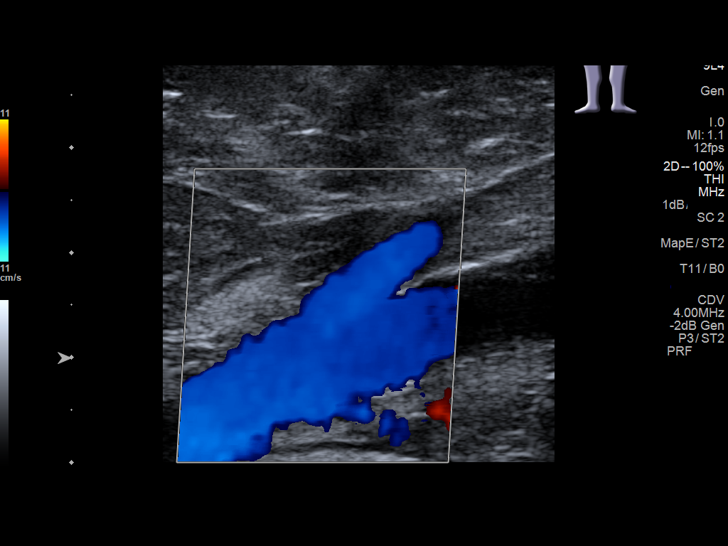
[im 13/36]
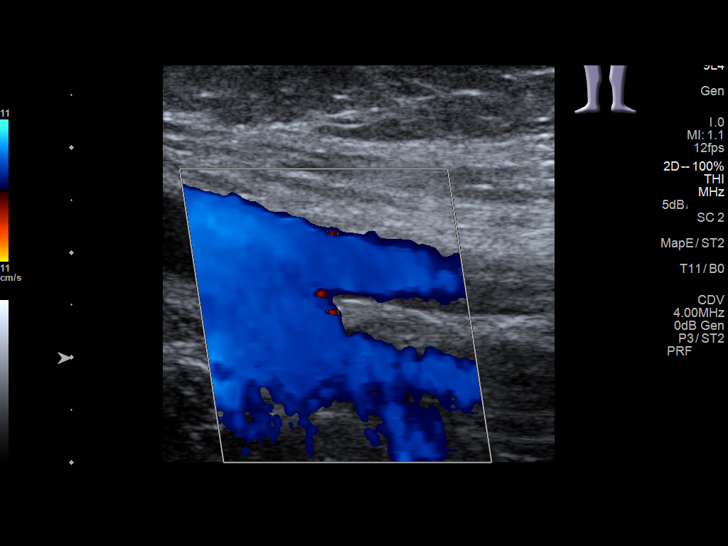
[im 16/36]
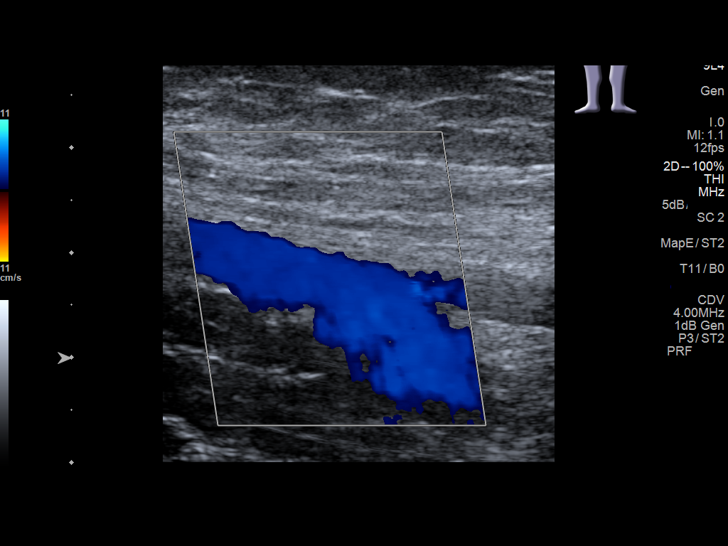
[im 19/36]
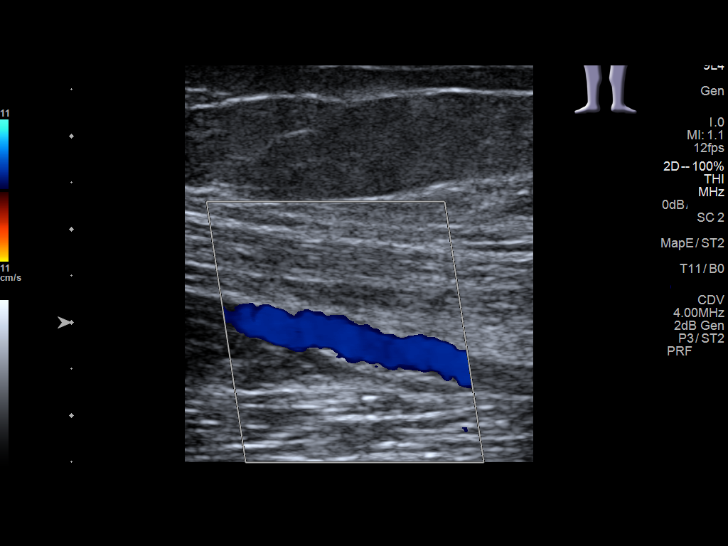
[im 20/36]
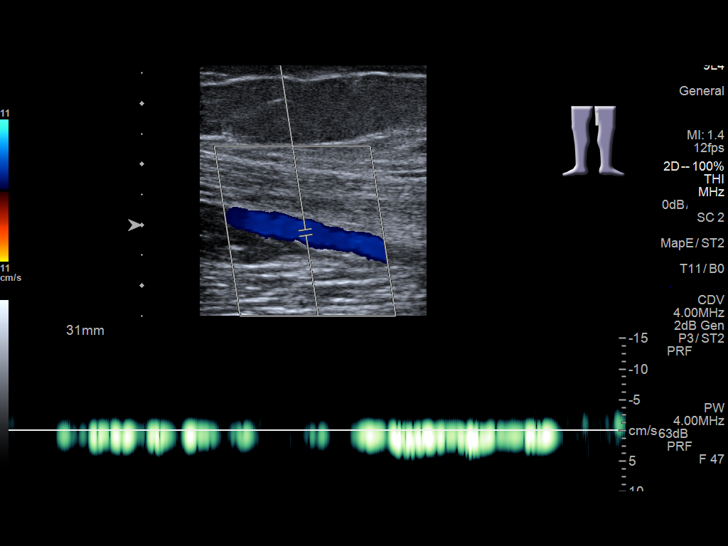
[im 23/36]
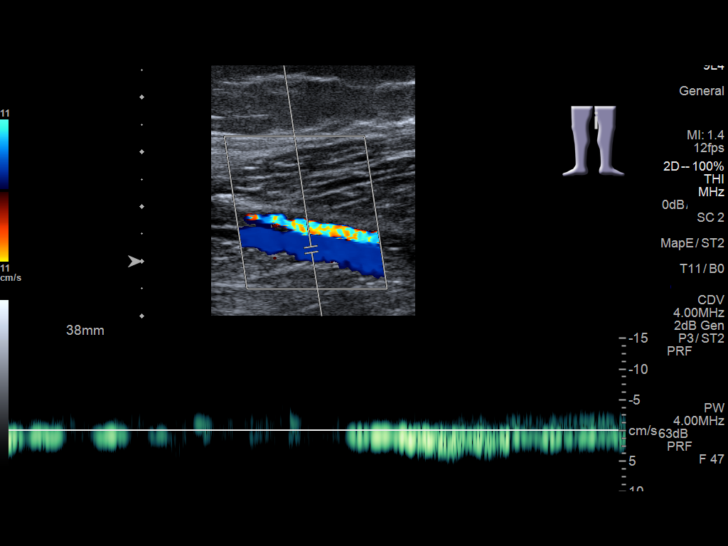
[im 26/36]
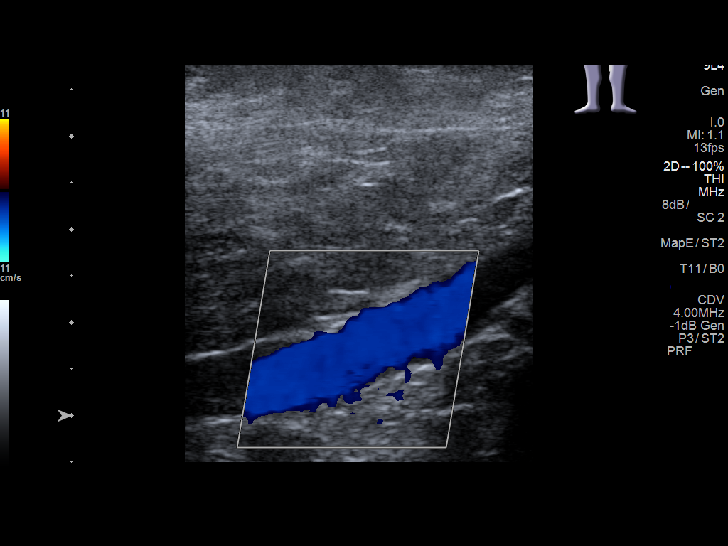
[im 29/36]
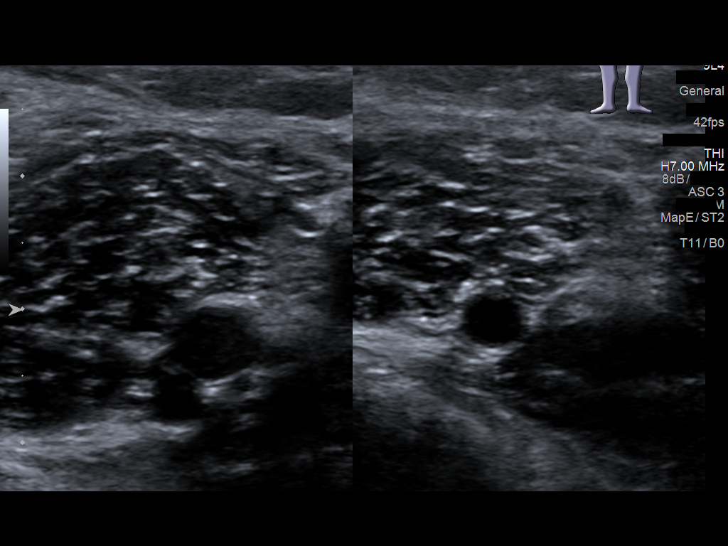
[im 32/36]
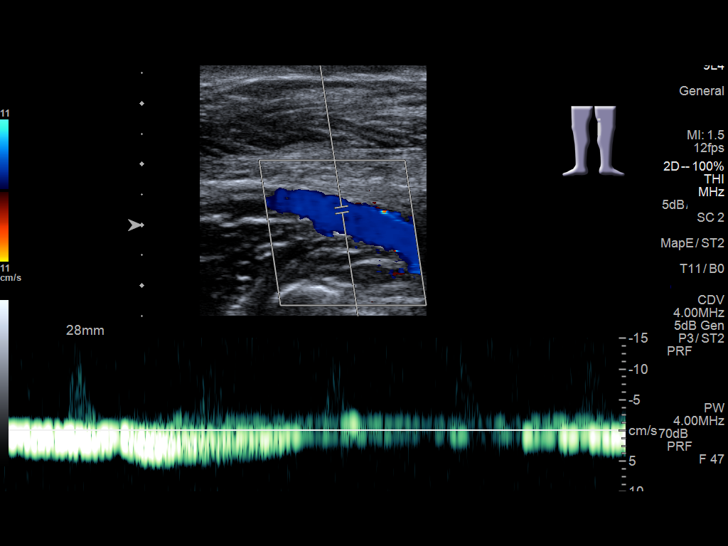
[im 36/36]
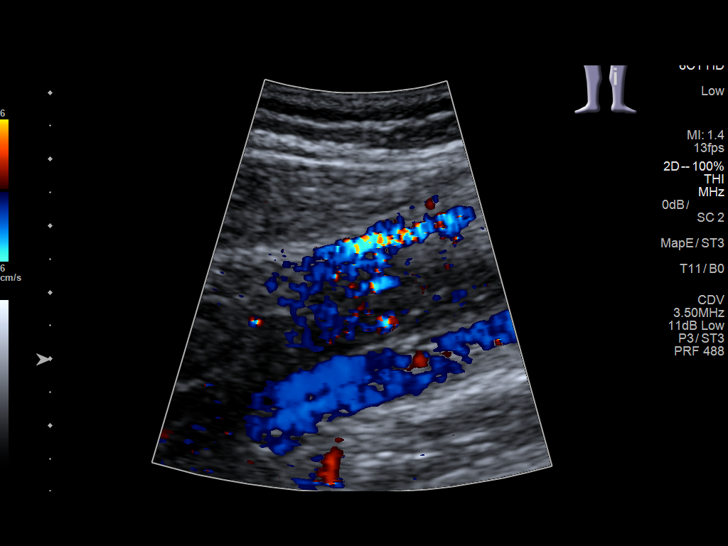

[13 of 24 positions shown; findings below may reference images not displayed]

FINDINGS: Contralateral Common Femoral Vein: Respiratory phasicity is normal
and symmetric with the symptomatic side. No evidence of thrombus.
Normal compressibility.

Common Femoral Vein: No evidence of thrombus. Normal
compressibility, respiratory phasicity and response to augmentation.

Saphenofemoral Junction: No evidence of thrombus. Normal
compressibility and flow on color Doppler imaging.

Profunda Femoral Vein: No evidence of thrombus. Normal
compressibility and flow on color Doppler imaging.

Femoral Vein: No evidence of thrombus. Normal compressibility,
respiratory phasicity and response to augmentation.

Popliteal Vein: No evidence of acute DVT. There is a thin echogenic
nonocclusive intimal web within the left popliteal vein (images 26 -
29), potentially the sequela of prior DVT.

Calf Veins: No evidence of thrombus. Normal compressibility and flow
on color Doppler imaging.

Superficial Great Saphenous Vein: No evidence of thrombus. Normal
compressibility.

Venous Reflux:  None.

Other Findings:  None.
IMPRESSION: 1. No evidence of acute DVT within the left lower extremity.
2. Thin echogenic nonocclusive intimal web within the left popliteal
vein, potentially the sequela of prior DVT.

## 2020-05-26 IMAGING — DX DG CHEST 2V
2 series · 2 of 2 positions shown · non-contrast
Comparison: 03/31/2018

CLINICAL DATA: Productive cough and shortness of breath for 2
months. Recent pneumonia.

EXAM:
CHEST - 2 VIEW

[w chest pa]
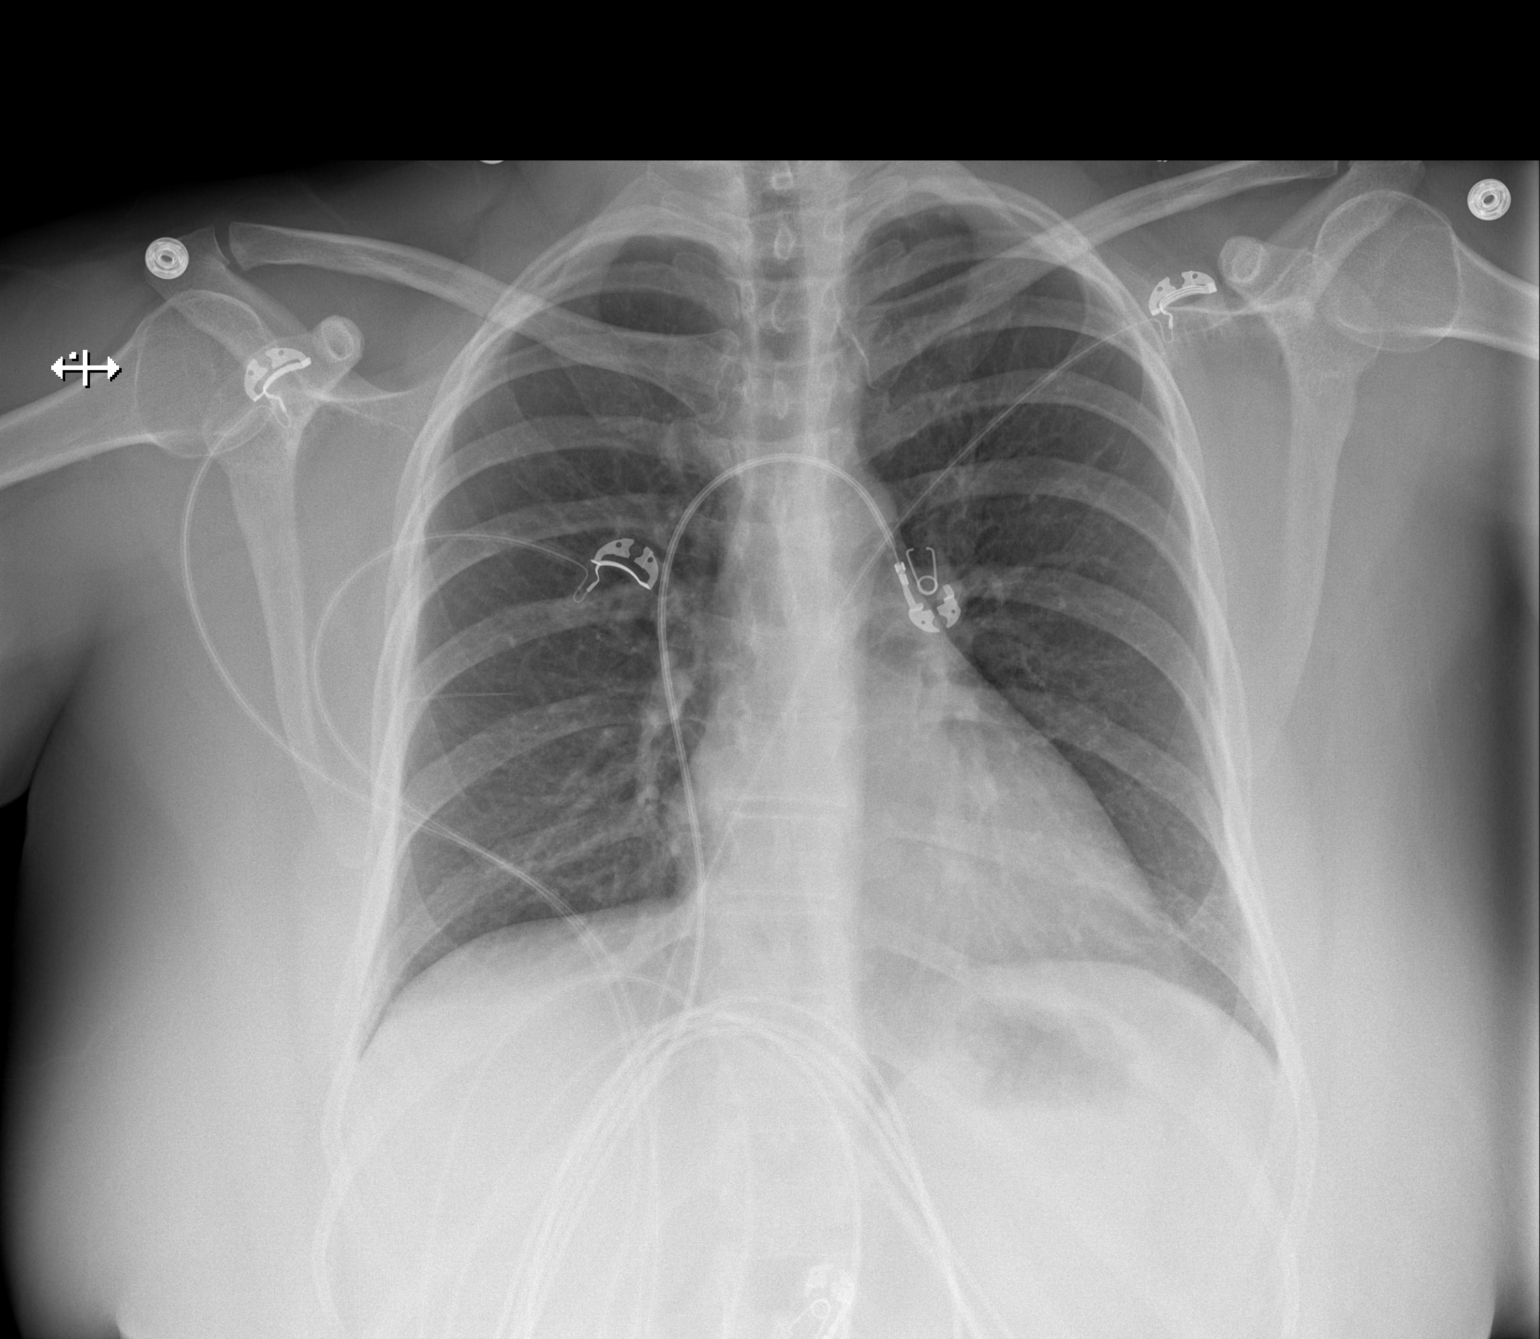

[w chest lat]
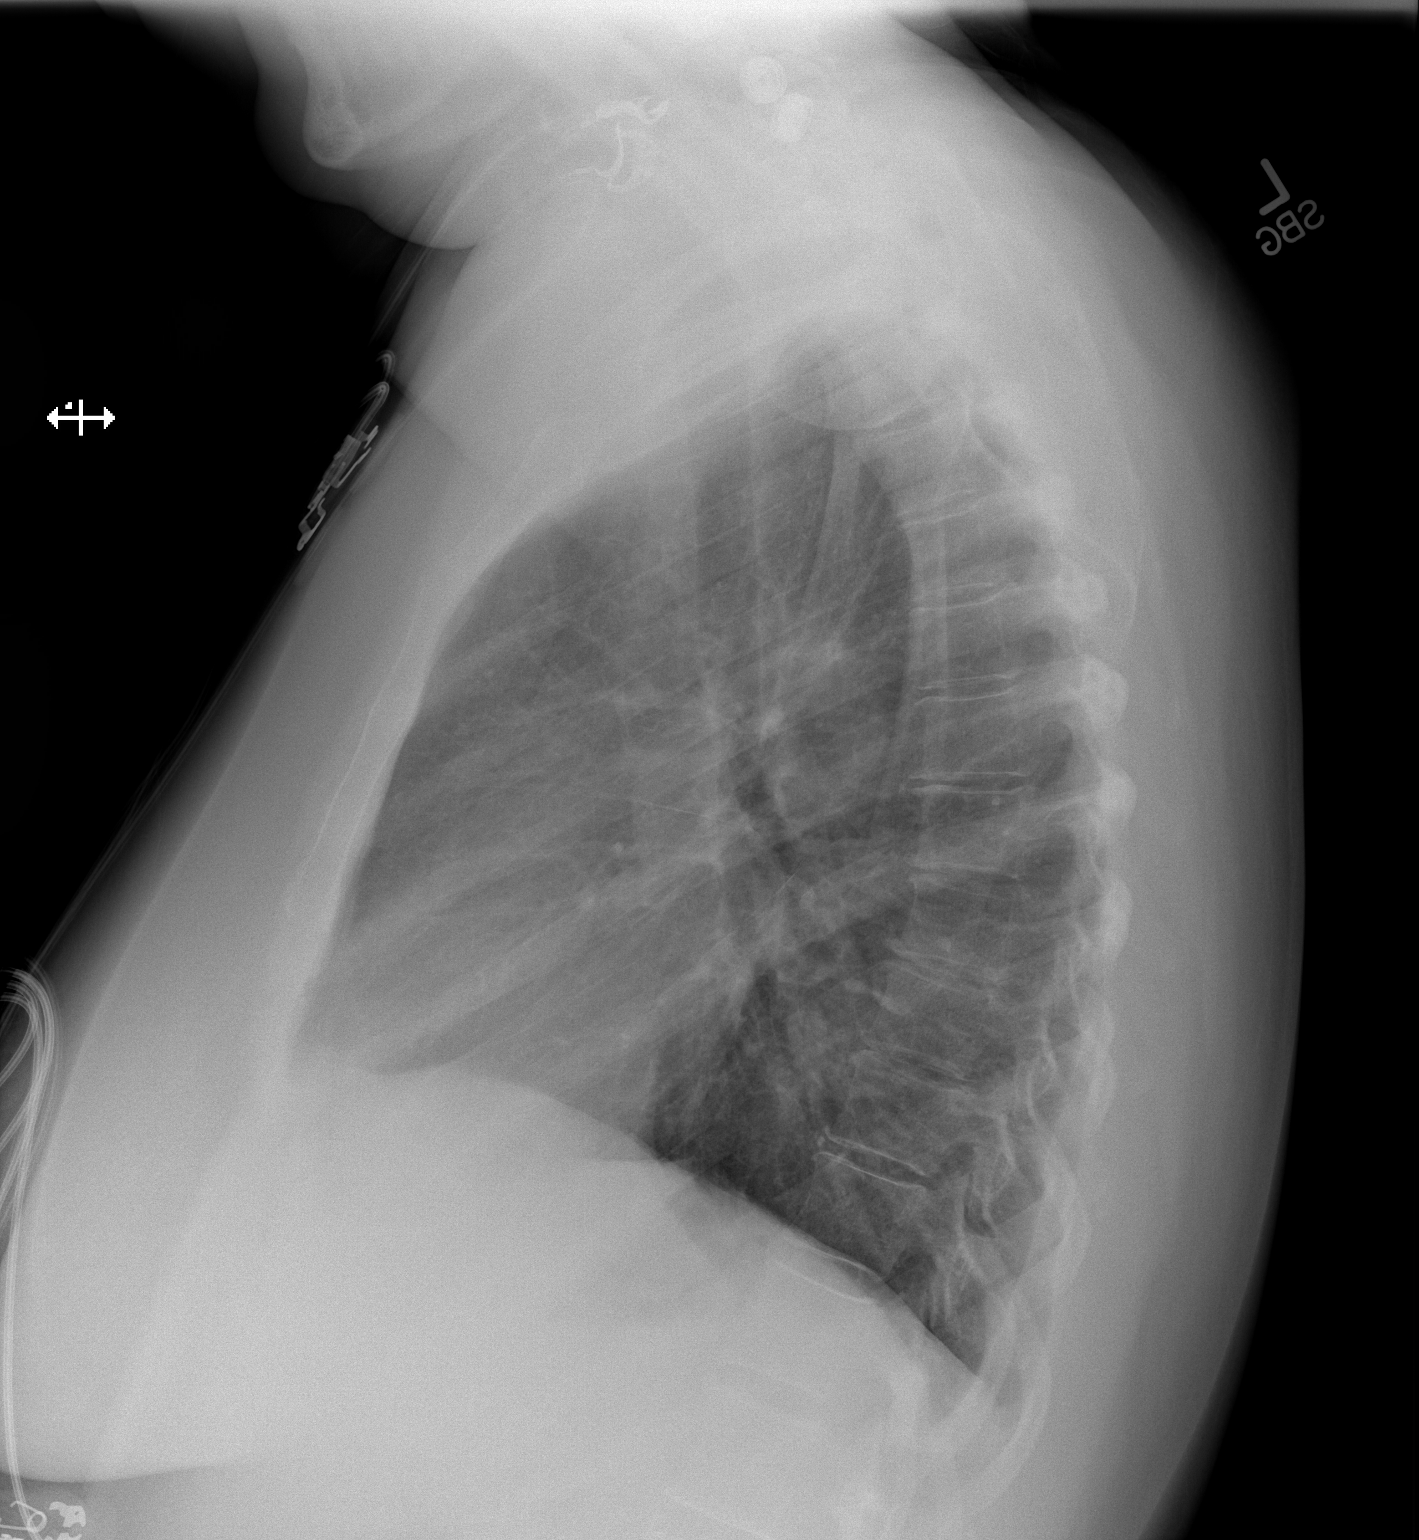

[2 of 2 positions shown; findings below may reference images not displayed]

FINDINGS: The heart size and mediastinal contours are within normal limits.
Both lungs are clear. Previously seen streaky opacity in the right
infrahilar region is no longer visualized. The visualized skeletal
structures are unremarkable.
IMPRESSION: No active cardiopulmonary disease.
